# Patient Record
Sex: Female | Born: 1955 | Race: White | Hispanic: No | Smoking: Current every day smoker
Health system: Southern US, Community
[De-identification: ages and names within clinical notes are randomized; demographics above are authoritative.]

## PROBLEM LIST (undated history)

## (undated) DIAGNOSIS — M1990 Unspecified osteoarthritis, unspecified site: Secondary | ICD-10-CM

## (undated) DIAGNOSIS — J45909 Unspecified asthma, uncomplicated: Secondary | ICD-10-CM

## (undated) DIAGNOSIS — J449 Chronic obstructive pulmonary disease, unspecified: Secondary | ICD-10-CM

## (undated) DIAGNOSIS — J189 Pneumonia, unspecified organism: Secondary | ICD-10-CM

## (undated) DIAGNOSIS — E039 Hypothyroidism, unspecified: Secondary | ICD-10-CM

## (undated) DIAGNOSIS — G40409 Other generalized epilepsy and epileptic syndromes, not intractable, without status epilepticus: Secondary | ICD-10-CM

## (undated) DIAGNOSIS — J42 Unspecified chronic bronchitis: Secondary | ICD-10-CM

## (undated) HISTORY — PX: CATARACT EXTRACTION W/ INTRAOCULAR LENS  IMPLANT, BILATERAL: SHX1307

## (undated) HISTORY — PX: BREAST CYST EXCISION: SHX579

---

## 2016-04-02 ENCOUNTER — Encounter (HOSPITAL_COMMUNITY): Payer: Self-pay | Admitting: Emergency Medicine

## 2016-04-02 ENCOUNTER — Emergency Department (HOSPITAL_COMMUNITY): Payer: BLUE CROSS/BLUE SHIELD

## 2016-04-02 ENCOUNTER — Inpatient Hospital Stay (HOSPITAL_COMMUNITY)
Admission: EM | Admit: 2016-04-02 | Discharge: 2016-04-03 | DRG: 641 | Disposition: A | Discharge status: Rehabilitation Facility | Payer: BLUE CROSS/BLUE SHIELD | Attending: Internal Medicine | Admitting: Internal Medicine

## 2016-04-02 DIAGNOSIS — R569 Unspecified convulsions: Secondary | ICD-10-CM

## 2016-04-02 DIAGNOSIS — H538 Other visual disturbances: Secondary | ICD-10-CM | POA: Diagnosis present

## 2016-04-02 DIAGNOSIS — Z79899 Other long term (current) drug therapy: Secondary | ICD-10-CM | POA: Diagnosis not present

## 2016-04-02 DIAGNOSIS — F10239 Alcohol dependence with withdrawal, unspecified: Secondary | ICD-10-CM | POA: Diagnosis present

## 2016-04-02 DIAGNOSIS — Z833 Family history of diabetes mellitus: Secondary | ICD-10-CM | POA: Diagnosis not present

## 2016-04-02 DIAGNOSIS — R251 Tremor, unspecified: Secondary | ICD-10-CM

## 2016-04-02 DIAGNOSIS — Z8249 Family history of ischemic heart disease and other diseases of the circulatory system: Secondary | ICD-10-CM

## 2016-04-02 DIAGNOSIS — E871 Hypo-osmolality and hyponatremia: Secondary | ICD-10-CM | POA: Diagnosis present

## 2016-04-02 DIAGNOSIS — G40909 Epilepsy, unspecified, not intractable, without status epilepticus: Secondary | ICD-10-CM | POA: Diagnosis present

## 2016-04-02 DIAGNOSIS — E039 Hypothyroidism, unspecified: Secondary | ICD-10-CM

## 2016-04-02 DIAGNOSIS — Z885 Allergy status to narcotic agent status: Secondary | ICD-10-CM | POA: Diagnosis not present

## 2016-04-02 DIAGNOSIS — J449 Chronic obstructive pulmonary disease, unspecified: Secondary | ICD-10-CM | POA: Diagnosis present

## 2016-04-02 DIAGNOSIS — Z888 Allergy status to other drugs, medicaments and biological substances status: Secondary | ICD-10-CM | POA: Diagnosis not present

## 2016-04-02 DIAGNOSIS — Z720 Tobacco use: Secondary | ICD-10-CM | POA: Diagnosis not present

## 2016-04-02 DIAGNOSIS — R079 Chest pain, unspecified: Secondary | ICD-10-CM | POA: Diagnosis present

## 2016-04-02 DIAGNOSIS — Z90710 Acquired absence of both cervix and uterus: Secondary | ICD-10-CM | POA: Diagnosis not present

## 2016-04-02 DIAGNOSIS — R8290 Unspecified abnormal findings in urine: Secondary | ICD-10-CM

## 2016-04-02 DIAGNOSIS — F1010 Alcohol abuse, uncomplicated: Secondary | ICD-10-CM

## 2016-04-02 DIAGNOSIS — Z886 Allergy status to analgesic agent status: Secondary | ICD-10-CM

## 2016-04-02 DIAGNOSIS — F17200 Nicotine dependence, unspecified, uncomplicated: Secondary | ICD-10-CM

## 2016-04-02 HISTORY — DX: Chronic obstructive pulmonary disease, unspecified: J44.9

## 2016-04-02 HISTORY — DX: Unspecified chronic bronchitis: J42

## 2016-04-02 HISTORY — DX: Unspecified osteoarthritis, unspecified site: M19.90

## 2016-04-02 HISTORY — DX: Unspecified asthma, uncomplicated: J45.909

## 2016-04-02 HISTORY — DX: Other generalized epilepsy and epileptic syndromes, not intractable, without status epilepticus: G40.409

## 2016-04-02 HISTORY — DX: Pneumonia, unspecified organism: J18.9

## 2016-04-02 HISTORY — DX: Hypothyroidism, unspecified: E03.9

## 2016-04-02 LAB — RAPID URINE DRUG SCREEN, HOSP PERFORMED
Amphetamines: NOT DETECTED
BARBITURATES: NOT DETECTED
BENZODIAZEPINES: NOT DETECTED
COCAINE: NOT DETECTED
Opiates: NOT DETECTED
Tetrahydrocannabinol: NOT DETECTED

## 2016-04-02 LAB — VALPROIC ACID LEVEL: Valproic Acid Lvl: 95 ug/mL (ref 50.0–100.0)

## 2016-04-02 LAB — URINALYSIS, ROUTINE W REFLEX MICROSCOPIC
Bilirubin Urine: NEGATIVE
GLUCOSE, UA: NEGATIVE mg/dL
Hgb urine dipstick: NEGATIVE
Ketones, ur: NEGATIVE mg/dL
Nitrite: NEGATIVE
Protein, ur: NEGATIVE mg/dL
SPECIFIC GRAVITY, URINE: 1.015 (ref 1.005–1.030)
pH: 7 (ref 5.0–8.0)

## 2016-04-02 LAB — CBC
HEMATOCRIT: 36.7 % (ref 36.0–46.0)
Hemoglobin: 12.4 g/dL (ref 12.0–15.0)
MCH: 29.3 pg (ref 26.0–34.0)
MCHC: 33.8 g/dL (ref 30.0–36.0)
MCV: 86.8 fL (ref 78.0–100.0)
PLATELETS: 167 10*3/uL (ref 150–400)
RBC: 4.23 MIL/uL (ref 3.87–5.11)
RDW: 13.7 % (ref 11.5–15.5)
WBC: 5.7 10*3/uL (ref 4.0–10.5)

## 2016-04-02 LAB — BASIC METABOLIC PANEL
ANION GAP: 9 (ref 5–15)
BUN: 9 mg/dL (ref 6–20)
CALCIUM: 9.2 mg/dL (ref 8.9–10.3)
CO2: 26 mmol/L (ref 22–32)
Chloride: 97 mmol/L — ABNORMAL LOW (ref 101–111)
Creatinine, Ser: 0.74 mg/dL (ref 0.44–1.00)
GLUCOSE: 108 mg/dL — AB (ref 65–99)
POTASSIUM: 4.6 mmol/L (ref 3.5–5.1)
SODIUM: 132 mmol/L — AB (ref 135–145)

## 2016-04-02 LAB — TROPONIN I
Troponin I: <0.03 ng/mL (ref <0.03)
Troponin I: <0.03 ng/mL (ref <0.03)

## 2016-04-02 LAB — COMPREHENSIVE METABOLIC PANEL
ALT: 13 U/L — AB (ref 14–54)
AST: 26 U/L (ref 15–41)
Albumin: 3.7 g/dL (ref 3.5–5.0)
Alkaline Phosphatase: 70 U/L (ref 38–126)
Anion gap: 9 (ref 5–15)
BILIRUBIN TOTAL: 0.7 mg/dL (ref 0.3–1.2)
BUN: 11 mg/dL (ref 6–20)
CHLORIDE: 88 mmol/L — AB (ref 101–111)
CO2: 26 mmol/L (ref 22–32)
CREATININE: 0.57 mg/dL (ref 0.44–1.00)
Calcium: 9.6 mg/dL (ref 8.9–10.3)
Glucose, Bld: 95 mg/dL (ref 65–99)
Potassium: 4.6 mmol/L (ref 3.5–5.1)
Sodium: 123 mmol/L — ABNORMAL LOW (ref 135–145)
Total Protein: 6.3 g/dL — ABNORMAL LOW (ref 6.5–8.1)

## 2016-04-02 LAB — ETHANOL

## 2016-04-02 LAB — LIPASE, BLOOD: Lipase: 31 U/L (ref 11–51)

## 2016-04-02 LAB — TSH: TSH: 0.37 u[IU]/mL (ref 0.350–4.500)

## 2016-04-02 MED ORDER — SODIUM CHLORIDE 0.9 % IV SOLN
INTRAVENOUS | Status: DC
  Administered 2016-04-03: 100 mL/h via INTRAVENOUS

## 2016-04-02 MED ORDER — ACETAMINOPHEN 650 MG RE SUPP: 650.0000 mg | Freq: Four times a day (QID) | RECTAL | Status: DC | PRN

## 2016-04-02 MED ORDER — ACETAMINOPHEN 325 MG PO TABS: 650.0000 mg | ORAL_TABLET | Freq: Four times a day (QID) | ORAL | Status: DC | PRN

## 2016-04-02 MED ORDER — DIVALPROEX SODIUM 250 MG PO DR TAB
500.0000 mg | DELAYED_RELEASE_TABLET | Freq: Three times a day (TID) | ORAL | Status: DC
  Administered 2016-04-02 – 2016-04-03 (×4): 500 mg via ORAL
  Filled 2016-04-02 (×4): qty 2

## 2016-04-02 MED ORDER — FOLIC ACID 1 MG PO TABS
1.0000 mg | ORAL_TABLET | Freq: Every day | ORAL | Status: DC
  Administered 2016-04-02 – 2016-04-03 (×2): 1 mg via ORAL
  Filled 2016-04-02 (×2): qty 1

## 2016-04-02 MED ORDER — ALBUTEROL SULFATE (2.5 MG/3ML) 0.083% IN NEBU: 2.5000 mg | INHALATION_SOLUTION | Freq: Four times a day (QID) | RESPIRATORY_TRACT | Status: DC | PRN

## 2016-04-02 MED ORDER — ALBUTEROL SULFATE HFA 108 (90 BASE) MCG/ACT IN AERS: 1.0000 | INHALATION_SPRAY | Freq: Four times a day (QID) | RESPIRATORY_TRACT | Status: DC | PRN

## 2016-04-02 MED ORDER — ENOXAPARIN SODIUM 40 MG/0.4ML ~~LOC~~ SOLN: 40.0000 mg | SUBCUTANEOUS | Status: DC

## 2016-04-02 MED ORDER — THIAMINE HCL 100 MG/ML IJ SOLN: 100.0000 mg | Freq: Every day | INTRAMUSCULAR | Status: DC

## 2016-04-02 MED ORDER — SODIUM CHLORIDE 0.9 % IV SOLN
Freq: Once | INTRAVENOUS | Status: AC
  Administered 2016-04-02: 16:00:00 via INTRAVENOUS

## 2016-04-02 MED ORDER — SODIUM CHLORIDE 0.9 % IV BOLUS (SEPSIS)
1000.0000 mL | Freq: Once | INTRAVENOUS | Status: AC
  Administered 2016-04-02: 1000 mL via INTRAVENOUS

## 2016-04-02 MED ORDER — ADULT MULTIVITAMIN W/MINERALS CH
1.0000 | ORAL_TABLET | Freq: Every day | ORAL | Status: DC
  Administered 2016-04-02 – 2016-04-03 (×2): 1 via ORAL
  Filled 2016-04-02 (×2): qty 1

## 2016-04-02 MED ORDER — ONDANSETRON HCL 4 MG/2ML IJ SOLN: 4.0000 mg | Freq: Four times a day (QID) | INTRAMUSCULAR | Status: DC | PRN

## 2016-04-02 MED ORDER — ONDANSETRON HCL 4 MG PO TABS: 4.0000 mg | ORAL_TABLET | Freq: Four times a day (QID) | ORAL | Status: DC | PRN

## 2016-04-02 MED ORDER — IPRATROPIUM-ALBUTEROL 0.5-2.5 (3) MG/3ML IN SOLN
3.0000 mL | Freq: Two times a day (BID) | RESPIRATORY_TRACT | Status: DC
  Administered 2016-04-02 – 2016-04-03 (×2): 3 mL via RESPIRATORY_TRACT
  Filled 2016-04-02 (×2): qty 3

## 2016-04-02 MED ORDER — IPRATROPIUM-ALBUTEROL 20-100 MCG/ACT IN AERS: 1.0000 | INHALATION_SPRAY | Freq: Two times a day (BID) | RESPIRATORY_TRACT | Status: DC

## 2016-04-02 MED ORDER — NICOTINE 21 MG/24HR TD PT24
21.0000 mg | MEDICATED_PATCH | Freq: Once | TRANSDERMAL | Status: AC
  Administered 2016-04-02: 21 mg via TRANSDERMAL
  Filled 2016-04-02: qty 1

## 2016-04-02 MED ORDER — LORAZEPAM 2 MG/ML IJ SOLN: 1.0000 mg | Freq: Four times a day (QID) | INTRAMUSCULAR | Status: DC | PRN

## 2016-04-02 MED ORDER — LEVOTHYROXINE SODIUM 75 MCG PO TABS
75.0000 ug | ORAL_TABLET | Freq: Every day | ORAL | Status: DC
  Administered 2016-04-03: 75 ug via ORAL
  Filled 2016-04-02: qty 1

## 2016-04-02 MED ORDER — VITAMIN B-1 100 MG PO TABS
100.0000 mg | ORAL_TABLET | Freq: Every day | ORAL | Status: DC
  Administered 2016-04-02 – 2016-04-03 (×2): 100 mg via ORAL
  Filled 2016-04-02 (×2): qty 1

## 2016-04-02 MED ORDER — LORAZEPAM 1 MG PO TABS
1.0000 mg | ORAL_TABLET | Freq: Four times a day (QID) | ORAL | Status: DC | PRN
  Administered 2016-04-03: 1 mg via ORAL
  Filled 2016-04-02: qty 1

## 2016-04-02 MED ORDER — ENOXAPARIN SODIUM 40 MG/0.4ML ~~LOC~~ SOLN
40.0000 mg | SUBCUTANEOUS | Status: DC
  Administered 2016-04-02: 40 mg via SUBCUTANEOUS
  Filled 2016-04-02: qty 0.4

## 2016-04-02 MED ORDER — ALUM & MAG HYDROXIDE-SIMETH 200-200-20 MG/5ML PO SUSP
30.0000 mL | Freq: Four times a day (QID) | ORAL | Status: DC | PRN
  Administered 2016-04-03: 30 mL via ORAL
  Filled 2016-04-02: qty 30

## 2016-04-02 NOTE — ED Notes (Signed)
EKG given to Dr. Campos 

## 2016-04-02 NOTE — ED Notes (Signed)
Pt returned to hallway bed from x-ray.  

## 2016-04-02 NOTE — ED Triage Notes (Addendum)
Was at [facility] for rehab , has been detox for benzo and etoh and was in a class and started to have  Sharp cp/ upper epigastric pain  with some nausea and broke out in sweat, pt felt she may benn to hot and once they gave her 4 baby asa and took her oout of  Room she felt better, per [facility] pt was unsteady on her feet but may have  Been her tegratol pt states, pt reports blurred vision since yesterday

## 2016-04-02 NOTE — Progress Notes (Signed)
Anna DelayMary [name] [id number] Admitted to 5W14: 04/02/2016 6:56 PM Attending Provider: Ozella Rocksavid J Merrell, MD    Anna [name] is a 61 y.o. female patient admitted from ED awake, alert  & orientated  X 3,  Full Code, VSS - Blood pressure 128/78, pulse 78, temperature 99 F (37.2 C), temperature source Oral, resp. rate 16, height 5\' 2"  (1.575 m), weight 50.8 kg (112 lb), SpO2 99 %., R/A, no c/o shortness of breath, no c/o chest pain, no distress noted. Tele # [id number] placed and pt is currently running:normal sinus rhythm, 1st degree heartblock.   IV site WDL:  Antecubital, left condition patent and no redness with a transparent dsg that's clean dry and intact.  Allergies:   Allergies  Allergen Reactions  . Asa [Aspirin]     upset stomach  . Dilantin [Phenytoin Sodium Extended]     Does not stop seizures  . Morphine And Related     Anaphylaxis      Past Medical History:  Diagnosis Date  . Seizures (HCC)     History:  Will be obtained from the patient.  Pt orientation to unit, room and routine. Information packet given to patient/family and safety video watched.  Admission INP armband ID verified with patient/family, and in place. SR up x 2, fall risk assessment complete with Patient and family verbalizing understanding of risks associated with falls. Pt verbalizes an understanding of how to use the call bell and to call for help before getting out of bed.  Skin, clean-dry- intact without evidence of bruising, or skin tears, red rash noted on right side of back, right and left buttock cheek .   No evidence of skin break down noted on exam.    Will cont to monitor and assist as needed.  Joana ReamerJohnson, Tyvon Eggenberger C, RN 04/02/2016 6:56 PM

## 2016-04-02 NOTE — Progress Notes (Signed)
Bedelia PersonEric Isbanioly, RN from [facility] called to get updates on pt. And stated they are holding her bed there.  Will continue to follow.  Forbes Cellarelcine Aleph Nickson, RN

## 2016-04-02 NOTE — ED Notes (Signed)
Pt transported to xray 

## 2016-04-02 NOTE — ED Provider Notes (Signed)
[facility]-EMERGENCY DEPT Provider Note   CSN: [id number] Arrival date & time: 04/02/16  1227     History   Chief Complaint Chief Complaint  Patient presents with  . Chest Pain    HPI Anna [name] is a 61 y.o. female.  HPI   Anna [name] is a 61 y.o. female, with a history of seizures, alcohol abuse, and benzodiazepine abuse, presenting to the ED with Onset of chest pain while at rest today. Patient states that she was sitting in a classroom watching a video when she began to feel sweaty and nauseous, followed by central chest pain. The chest pain was sharp, moderate, nonradiating. The pain lasted for approximately 15 minutes. 324mg  ASA at the facility prior to EMS arrival. Patient also endorses intermittent episodes of "unsteadiness" and bilateral blurred vision beginning yesterday. Patient states, "I feel like I just can't catch my balance." Patient complains of a nonproductive cough for the last couple weeks. Patient had one of these episodes following the chest pain today. Patient is currently pain and complaint free. Pt adds that she is compliant with her Depakote and states that [facility] Teg[facility] to her regimen beginning on February 9. She denies fever, vomiting/diarrhea, abdominal pain, shortness of breath, or any other complaints.  Patient states that she was admitted [facility] on February [facility] for hyponatremia and detox from benzodiazepines. She was admitted for 6 days. Upon discharge, [facility] and [facility] from alcohol. She began treatment there on February 9.    Past Medical History:  Diagnosis Date  . Seizures [facility](HCC)     Patient Active Problem List   Diagnosis Date Noted  . Hyponatremia 04/02/2016    History reviewed. No pertinent surgical history.  OB History    No data available       Home Medications    Prior to Admission medications   Medication Sig Start Date End Date Taking? Authorizing  Provider  albuterol (PROVENTIL HFA;VENTOLIN HFA) 108 (90 Base) MCG/ACT inhaler Inhale 1 puff into the lungs every 6 (six) hours as needed for wheezing or shortness of breath.   Yes Historical Provider, MD  divalproex (DEPAKOTE) 500 MG DR tablet Take 500 mg by mouth 3 (three) times daily.   Yes Historical Provider, MD  folic acid (FOLVITE) 1 MG tablet Take 1 mg by mouth daily.   Yes Historical Provider, MD  Ipratropium-Albuterol (COMBIVENT RESPIMAT) 20-100 MCG/ACT AERS respimat Inhale 1 puff into the lungs 2 (two) times daily.   Yes Historical Provider, MD  levothyroxine (SYNTHROID, LEVOTHROID) 75 MCG tablet Take 75 mcg by mouth daily before breakfast.   Yes Historical Provider, MD    Family History No family history on file.  Social History Social History  Substance Use Topics  . Smoking status: Never Smoker  . Smokeless tobacco: Never Used  . Alcohol use No     Allergies   Asa [aspirin]; Dilantin [phenytoin sodium extended]; and Morphine and related   Review of Systems Review of Systems  Constitutional: Positive for diaphoresis (resolved). Negative for chills and fever.  Respiratory: Positive for cough. Negative for shortness of breath.   Cardiovascular: Positive for chest pain (resolved).  Gastrointestinal: Positive for nausea. Negative for abdominal pain, blood in stool, constipation, diarrhea and vomiting.  Genitourinary: Negative for dysuria.  Neurological: Positive for tremors. Negative for dizziness, syncope, weakness, light-headedness, numbness and headaches.       Balance issue  All other systems reviewed and are negative.  Physical Exam Updated Vital Signs BP 136/78 (BP Location: Right Arm)   Pulse 74   Temp 98.6 F (37 C) (Oral)   Resp 16   SpO2 100%   Physical Exam  Constitutional: She is oriented to person, place, and time. She appears well-developed and well-nourished. No distress.  HENT:  Head: Normocephalic and atraumatic.  Mouth/Throat: Oropharynx  is clear and moist.  Eyes: Conjunctivae and EOM are normal. Pupils are equal, round, and reactive to light.  Neck: Normal range of motion. Neck supple.  Cardiovascular: Normal rate, regular rhythm, normal heart sounds and intact distal pulses.   Pulmonary/Chest: Effort normal and breath sounds normal. No respiratory distress.  Abdominal: Soft. There is no tenderness. There is no guarding.  Musculoskeletal: She exhibits no edema.  Lymphadenopathy:    She has no cervical adenopathy.  Neurological: She is alert and oriented to person, place, and time.  No sensory deficits. Strength 5/5 in all extremities. Coordination intact including heel to shin and finger to nose, but with an upper extremity bilateral resting and intention tremor. Cranial nerves III-XII grossly intact. No facial droop.   Skin: Skin is warm and dry. Capillary refill takes less than 2 seconds. She is not diaphoretic.  Psychiatric: She has a normal mood and affect. Her behavior is normal.  Nursing note and vitals reviewed.    ED Treatments / Results  Labs (all labs ordered are listed, but only abnormal results are displayed) Labs Reviewed  COMPREHENSIVE METABOLIC PANEL - Abnormal; Notable for the following:       Result Value   Sodium 123 (*)    Chloride 88 (*)    Total Protein 6.3 (*)    ALT 13 (*)    All other components within normal limits  URINALYSIS, ROUTINE W REFLEX MICROSCOPIC - Abnormal; Notable for the following:    APPearance CLOUDY (*)    Leukocytes, UA LARGE (*)    Bacteria, UA FEW (*)    Squamous Epithelial / LPF 0-5 (*)    All other components within normal limits  URINE CULTURE  CBC  LIPASE, BLOOD  RAPID URINE DRUG SCREEN, HOSP PERFORMED  ETHANOL  TROPONIN I  TROPONIN I  VALPROIC ACID LEVEL  TSH    EKG  EKG Interpretation  Date/Time:  Monday April 02 2016 12:54:43 EST Ventricular Rate:  77 PR Interval:  220 QRS Duration: 80 QT Interval:  378 QTC Calculation: 427 R Axis:   74 Text  Interpretation:  Sinus rhythm with 1st degree A-V block Possible Left atrial enlargement Borderline ECG No old tracing to compare Confirmed by CAMPOS  MD, Caryn Bee ([id number]) on 04/02/2016 1:56:48 PM       Radiology Dg Chest 2 View  Result Date: 04/02/2016 CLINICAL DATA:  Epigastric pain and nausea with sledding today, chest pain EXAM: CHEST  2 VIEW COMPARISON:  None FINDINGS: Normal heart size, mediastinal contours, and pulmonary vascularity. Atherosclerotic calcification aorta. Emphysematous changes with minimal central peribronchial thickening. No acute infiltrate, pleural effusion, or pneumothorax. Bones demineralized. 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 IMPRESSION: COPD changes without acute infiltrate. Aortic atherosclerosis. Electronically Signed   By: Ulyses Southward M.D.   On: 04/02/2016 13:12    Procedures Procedures (including critical care time)  Medications Ordered in ED Medications  sodium chloride 0.9 % bolus 1,000 mL (0 mLs Intravenous Stopped 04/02/16 1500)  0.9 %  sodium chloride infusion ( Intravenous New Bag/Given 04/02/16 1552)     Initial Impression / Assessment and Plan / ED Course  I have reviewed the triage vital signs and the nursing notes.  Pertinent labs & imaging results that were available during my care of the patient were reviewed by me and considered in my medical decision making (see chart for details).     Patient presents following an episode of chest discomfort along with accompanying symptoms. No recurrence of the patient's chest discomfort. Delta troponins negative. Patient is found to be hyponatremic at 123. This could explain some of the patient's symptoms, including the reported ataxia. No reported seizures.  4:07 PM Spoke with Dr. Konrad Dolores, hospitalist, who agreed to admit the patient.   Findings and plan of care discussed with Azalia Bilis, MD. Dr. Patria Mane personally evaluated and examined this patient.   Vitals:   04/02/16 1229 04/02/16 1241  BP:   136/78  Pulse:  74  Resp:  16  Temp:  98.6 F (37 C)  TempSrc:  Oral  SpO2: 99% 100%   Orthostatic VS for the past 24 hrs:  BP- Lying Pulse- Lying BP- Sitting Pulse- Sitting BP- Standing at 0 minutes Pulse- Standing at 0 minutes  04/02/16 1339 153/83 70 (!) 160/92 77 138/88 82      Final Clinical Impressions(s) / ED Diagnoses   Final diagnoses:  Hyponatremia    New Prescriptions New Prescriptions   No medications on file     Concepcion Living 04/02/16 1609    Azalia Bilis, MD 04/04/16 [phone]

## 2016-04-02 NOTE — H&P (Signed)
History and Physical    Merdis DelayMary Carothers ZOX:[id number] DOB: [birth year] DOA: 04/02/2016  PCP: No primary care provider on file. Patient coming from: home  Chief Complaint: CP and "can't catch my balance."  HPI: Merdis DelayMary Erway is a 61 y.o. female with medical history significant of Seizures.   Chest pain. Started during school while watching a video. Associated with nausea and diaphoresis. Patient states that the room was very hot. Sharp and nonradiating substernal. Lasted approximately 15 minutes. Chest pain relieved after she went to the school nurse and was given ASA 324 mg and rested. No other reported chest pain in the past. Denies any dyspnea on exertion, lower extremity swelling,.  Pt recently admitted [facility] for alcohol detox and hyponatremia. Patient unsure of what her sodium level was at that time. Patient stayed for approximately 9 days. Discharged on 03/31/2016. Patient reports feeling at her baseline at the time. Patient was not given any further medications including benzodiazepines. Patient started working with a local rehabilitation center for alcoholics. Again patient has been without any medications since time of discharge. Patient complaining of intermittent blurry vision and difficulty with balance. Denies any other focal abnormality.  Patient also with several history of intermittent tremor. Sometimes at rest and sometimes intention based.  ED Course: Objective findings outlined below. Given 1 L normal saline bolus and placed on the 21 mg nicotine patch.   Review of Systems: As per HPI otherwise 10 point review of systems negative.   Ambulatory Status: no restrictions  Past Medical History:  Diagnosis Date  . Seizures (HCC)     Past Surgical History:  Procedure Laterality Date  . ABDOMINAL HYSTERECTOMY    . CATARACT EXTRACTION      Social History   Social History  . Marital status: Widowed    Spouse name: N/A  . Number of children: N/A  . Years of  education: N/A   Occupational History  . Not on file.   Social History Main Topics  . Smoking status: Never Smoker  . Smokeless tobacco: Never Used  . Alcohol use Yes  . Drug use: Unknown  . Sexual activity: Not on file   Other Topics Concern  . Not on file   Social History Narrative  . No narrative on file    Allergies  Allergen Reactions  . Asa [Aspirin]     upset stomach  . Dilantin [Phenytoin Sodium Extended]     Does not stop seizures  . Morphine And Related     Anaphylaxis     Family History  Problem Relation Age of Onset  . Diabetes Mother   . Heart failure Mother   . Cancer Father     Prior to Admission medications   Medication Sig Start Date End Date Taking? Authorizing Provider  albuterol (PROVENTIL HFA;VENTOLIN HFA) 108 (90 Base) MCG/ACT inhaler Inhale 1 puff into the lungs every 6 (six) hours as needed for wheezing or shortness of breath.   Yes Historical Provider, MD  divalproex (DEPAKOTE) 500 MG DR tablet Take 500 mg by mouth 3 (three) times daily.   Yes Historical Provider, MD  folic acid (FOLVITE) 1 MG tablet Take 1 mg by mouth daily.   Yes Historical Provider, MD  Ipratropium-Albuterol (COMBIVENT RESPIMAT) 20-100 MCG/ACT AERS respimat Inhale 1 puff into the lungs 2 (two) times daily.   Yes Historical Provider, MD  levothyroxine (SYNTHROID, LEVOTHROID) 75 MCG tablet Take 75 mcg by mouth daily before breakfast.   Yes Historical Provider, MD  Physical Exam: Vitals:   04/02/16 1229 04/02/16 1241 04/02/16 1635 04/02/16 1736  BP:  136/78 107/76   Pulse:  74 88   Resp:  16 16   Temp:  98.6 F (37 C)    TempSrc:  Oral    SpO2: 99% 100% 97%   Weight:    50.8 kg (112 lb)  Height:    5\' 2"  (1.575 m)     General:  Appears calm and comfortable Eyes:  PERRL, EOMI, normal lids, iris ENT:  grossly normal hearing, lips & tongue, mmm Neck:  no LAD, masses or thyromegaly Cardiovascular:  RRR, no m/r/g. No LE edema.  Respiratory:  CTA bilaterally, no  w/r/r. Normal respiratory effort. Abdomen:  soft, ntnd, NABS Skin:  no rash or induration seen on limited exam Musculoskeletal:  grossly normal tone BUE/BLE, good ROM, no bony abnormality Psychiatric:  grossly normal mood and affect, speech fluent and appropriate, AOx3 Neurologic: Intermittent intention tremor. CN 2-12 grossly intact, moves all extremities in coordinated fashion, sensation intact  Labs on Admission: I have personally reviewed following labs and imaging studies  CBC:  Recent Labs Lab 04/02/16 1239  WBC 5.7  HGB 12.4  HCT 36.7  MCV 86.8  PLT 167   Basic Metabolic Panel:  Recent Labs Lab 04/02/16 1239  NA 123*  K 4.6  CL 88*  CO2 26  GLUCOSE 95  BUN 11  CREATININE 0.57  CALCIUM 9.6   GFR: Estimated Creatinine Clearance: 59.9 mL/min (by C-G formula based on SCr of 0.57 mg/dL). Liver Function Tests:  Recent Labs Lab 04/02/16 1239  AST 26  ALT 13*  ALKPHOS 70  BILITOT 0.7  PROT 6.3*  ALBUMIN 3.7    Recent Labs Lab 04/02/16 1239  LIPASE 31   No results for input(s): AMMONIA in the last 168 hours. Coagulation Profile: No results for input(s): INR, PROTIME in the last 168 hours. Cardiac Enzymes:  Recent Labs Lab 04/02/16 1239  TROPONINI <0.03   BNP (last 3 results) No results for input(s): PROBNP in the last 8760 hours. HbA1C: No results for input(s): HGBA1C in the last 72 hours. CBG: No results for input(s): GLUCAP in the last 168 hours. Lipid Profile: No results for input(s): CHOL, HDL, LDLCALC, TRIG, CHOLHDL, LDLDIRECT in the last 72 hours. Thyroid Function Tests: No results for input(s): TSH, T4TOTAL, FREET4, T3FREE, THYROIDAB in the last 72 hours. Anemia Panel: No results for input(s): VITAMINB12, FOLATE, FERRITIN, TIBC, IRON, RETICCTPCT in the last 72 hours. Urine analysis:    Component Value Date/Time   COLORURINE YELLOW 04/02/2016 1312   APPEARANCEUR CLOUDY (A) 04/02/2016 1312   LABSPEC 1.015 04/02/2016 1312   PHURINE  7.0 04/02/2016 1312   GLUCOSEU NEGATIVE 04/02/2016 1312   HGBUR NEGATIVE 04/02/2016 1312   BILIRUBINUR NEGATIVE 04/02/2016 1312   KETONESUR NEGATIVE 04/02/2016 1312   PROTEINUR NEGATIVE 04/02/2016 1312   NITRITE NEGATIVE 04/02/2016 1312   LEUKOCYTESUR LARGE (A) 04/02/2016 1312    Creatinine Clearance: Estimated Creatinine Clearance: 59.9 mL/min (by C-G formula based on SCr of 0.57 mg/dL).  Sepsis Labs: @LABRCNTIP (procalcitonin:4,lacticidven:4) )No results found for this or any previous visit (from the past 240 hour(s)).   Radiological Exams on Admission: Dg Chest 2 View  Result Date: 04/02/2016 CLINICAL DATA:  Epigastric pain and nausea with sledding today, chest pain EXAM: CHEST  2 VIEW COMPARISON:  None FINDINGS: Normal heart size, mediastinal contours, and pulmonary vascularity. Atherosclerotic calcification aorta. Emphysematous changes with minimal central peribronchial thickening. No acute infiltrate, pleural effusion, or  pneumothorax. Bones demineralized. 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 IMPRESSION: COPD changes without acute infiltrate. Aortic atherosclerosis. Electronically Signed   By: Ulyses Southward M.D.   On: 04/02/2016 13:12    EKG: Independently reviewed. Sinus. First-degree AV block. No ACS  Assessment/Plan Active Problems:   Hyponatremia   ETOH abuse   Seizures (HCC)   Abnormal urinalysis   Tremor   Hypothyroidism   Tobacco use disorder   COPD, mild (HCC)   Chest pain: Alcohol and benzodiazepine withdrawal versus cardiac versus psychosomatic versus infectious. EKG reassuring. Troponin negative. No cardiac history. - Telemetry, observation - Cycle troponins, EKG in a.m. - Outpatient follow-up with cardiology  Hyponatremia: 123. Likely from ETOH use. Recent admission for the same at [facility]. Goal of correction less than 47mmol/L/day.  - NS IVF - BMET Q12  EtOH withdrawal: Patient's baseline of 9 beers per day. States that she recently went  through with all hospitalization at [facility] for 9 days. Discharged 2 days ago without any further medications/benzodiazepines. Suspect that patient's complaints of intermittent blurry vision, balance issues, chest pain with diaphoresis likely from withdrawal. - CIWA - Social worker consult - Patient likely to need a limited course of benzodiazepine at time of discharge she has no primary care physician in the area.  Seizures: Patient sure of her last seizure. Withdrawal from significant alcohol use but patient risk for seizing. - Continue Depakote - Depakote level  Abnormal urinalysis: Patient without focal UTI type complaint. - Urine culture - Start antibiotics if patient becomes symptomatic or culture comes back positive  Tremor: Chronic for years. Anticipate improvement w/ benzodiazepine treatment.  - outpt f/u w/ Neuro (parkinsons vs ETOH withdrawal/induced vs other)  Hypothyroidism: - Continue Synthroid  COPD/asthma: - Continue Combivent, when necessary albuterol  Tobacco use: - Nicotine   DVT prophylaxis: Lovenox  Code Status: FULL  Family Communication: none  Disposition Plan: pending imporovement in NA and ETOH withdrawal  Consults called: none  Admission status: inpt    MERRELL, DAVID J MD [facility]  If 7PM-7AM, please contact night-coverage [email or web address] Password [facility]  04/02/2016, 5:42 PM

## 2016-04-02 NOTE — Progress Notes (Signed)
Report received 

## 2016-04-02 NOTE — ED Notes (Signed)
Attempted report x1. 

## 2016-04-03 DIAGNOSIS — E871 Hypo-osmolality and hyponatremia: Principal | ICD-10-CM

## 2016-04-03 DIAGNOSIS — F1010 Alcohol abuse, uncomplicated: Secondary | ICD-10-CM

## 2016-04-03 DIAGNOSIS — F17200 Nicotine dependence, unspecified, uncomplicated: Secondary | ICD-10-CM

## 2016-04-03 LAB — TROPONIN I

## 2016-04-03 LAB — URINE CULTURE

## 2016-04-03 LAB — CBC
HCT: 38.8 % (ref 36.0–46.0)
HEMOGLOBIN: 12.9 g/dL (ref 12.0–15.0)
MCH: 29.2 pg (ref 26.0–34.0)
MCHC: 33.2 g/dL (ref 30.0–36.0)
MCV: 87.8 fL (ref 78.0–100.0)
Platelets: 162 10*3/uL (ref 150–400)
RBC: 4.42 MIL/uL (ref 3.87–5.11)
RDW: 13.9 % (ref 11.5–15.5)
WBC: 4.9 10*3/uL (ref 4.0–10.5)

## 2016-04-03 LAB — BASIC METABOLIC PANEL
ANION GAP: 11 (ref 5–15)
BUN: 6 mg/dL (ref 6–20)
CALCIUM: 9.2 mg/dL (ref 8.9–10.3)
CO2: 24 mmol/L (ref 22–32)
Chloride: 99 mmol/L — ABNORMAL LOW (ref 101–111)
Creatinine, Ser: 0.43 mg/dL — ABNORMAL LOW (ref 0.44–1.00)
GLUCOSE: 65 mg/dL (ref 65–99)
POTASSIUM: 4.1 mmol/L (ref 3.5–5.1)
Sodium: 134 mmol/L — ABNORMAL LOW (ref 135–145)

## 2016-04-03 MED ORDER — CEPHALEXIN 500 MG PO CAPS: 500.0000 mg | ORAL_CAPSULE | Freq: Two times a day (BID) | ORAL | 0 refills | Status: AC

## 2016-04-03 MED ORDER — NICOTINE 21 MG/24HR TD PT24
21.0000 mg | MEDICATED_PATCH | Freq: Every day | TRANSDERMAL | Status: DC
  Administered 2016-04-03: 21 mg via TRANSDERMAL
  Filled 2016-04-03: qty 1

## 2016-04-03 MED ORDER — CEPHALEXIN 500 MG PO CAPS
500.0000 mg | ORAL_CAPSULE | Freq: Two times a day (BID) | ORAL | Status: DC
  Administered 2016-04-03: 500 mg via ORAL
  Filled 2016-04-03: qty 1

## 2016-04-03 NOTE — Progress Notes (Signed)
CSW confirmed with pt she is from [facility] and plan is to return at time of DC.  CSW spoke with RN at [facility] and faxed requested clinicals to facility (fax #: [phone])  RN to review and will set up transport for patient if approved  CSW will continue to follow  Burna SisJenna H. Renan Danese, LCSW Clinical Social Worker [phone]

## 2016-04-03 NOTE — Progress Notes (Signed)
NURSING PROGRESS NOTE  Anna [name] [id number] Discharge Data: 04/03/2016 3:01 PM Attending Provider: Albertine GratesFang Xu, MD PCP:Pcp Not In System     [name] per MD order.  Discussed with the patient the After Visit Summary and all questions fully answered. All IV's discontinued with no bleeding noted. All belongings returned to patient for patient to take home.   Last Vital Signs:  Blood pressure 127/88, pulse 81, temperature 98.8 F (37.1 C), temperature source Oral, resp. rate 20, height 5\' 2"  (1.575 m), weight 50.8 kg (112 lb), SpO2 99 %.  Discharge Medication List Allergies as of 04/03/2016      Reactions   Asa [aspirin]    upset stomach   Dilantin [phenytoin Sodium Extended]    Does not stop seizures   Morphine And Related    Anaphylaxis       Medication List    TAKE these medications   albuterol 108 (90 Base) MCG/ACT inhaler Commonly known as:  PROVENTIL HFA;VENTOLIN HFA Inhale 1 puff into the lungs every 6 (six) hours as needed for wheezing or shortness of breath.   cephALEXin 500 MG capsule Commonly known as:  KEFLEX Take 1 capsule (500 mg total) by mouth every 12 (twelve) hours.   COMBIVENT RESPIMAT 20-100 MCG/ACT Aers respimat Generic drug:  Ipratropium-Albuterol Inhale 1 puff into the lungs 2 (two) times daily.   divalproex 500 MG DR tablet Commonly known as:  DEPAKOTE Take 500 mg by mouth 3 (three) times daily.   folic acid 1 MG tablet Commonly known as:  FOLVITE Take 1 mg by mouth daily.   levothyroxine 75 MCG tablet Commonly known as:  SYNTHROID, LEVOTHROID Take 75 mcg by mouth daily before breakfast.

## 2016-04-03 NOTE — Progress Notes (Signed)
[facility].  Dois DavenportSandra, RN at [facility], requesting PT note because [facility]  PT ordered stat and cleared pt- PT notes sent to facility and facility sending transport to pick up patient.  CSW signing off  Burna SisJenna H. Dinita Migliaccio, LCSW Clinical Social Worker [phone]'

## 2016-04-03 NOTE — Discharge Summary (Signed)
Discharge Summary  Anna [name] ZOX:[id number] DOB: 1955-05-04  PCP: Pcp Not In System  Admit date: 04/02/2016 Discharge date: 04/03/2016  Time spent: <30mins  [facility]  Recommendations for Outpatient Follow-up:  1. F/u with PMD within a week  for hospital discharge follow up, repeat cbc/bmp at follow up 2. F/u with neurology   Discharge Diagnoses:  Active Hospital Problems   Diagnosis Date Noted  . Hyponatremia 04/02/2016  . ETOH abuse 04/02/2016  . Seizures (HCC) 04/02/2016  . Abnormal urinalysis 04/02/2016  . Tremor 04/02/2016  . Hypothyroidism 04/02/2016  . Tobacco use disorder 04/02/2016  . 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    Discharge Condition: stable  Diet recommendation: regular diet, free waster restriction to avoid hyponatremia Patient is advised to drink beverages that contains sodium  Filed Weights   04/02/16 1736  Weight: 50.8 kg (112 lb)    History of present illness:  Patient coming from: home  Chief Complaint: CP and "can't catch my balance."  HPI: Anna [name] is a 61 y.o. female with medical history significant of Seizures.   Chest pain. Started during school while watching a video. Associated with nausea and diaphoresis. Patient states that the room was very hot. Sharp and nonradiating substernal. Lasted approximately 15 minutes. Chest pain relieved after she went to the school nurse and was given ASA 324 mg and rested. No other reported chest pain in the past. Denies any dyspnea on exertion, lower extremity swelling,.  Pt recently admitted to [facility] for alcohol detox and hyponatremia. Patient unsure of what her sodium level was at that time. Patient stayed for approximately 9 days. Discharged on 03/31/2016. Patient reports feeling at her baseline at the time. Patient was not given any further medications  including benzodiazepines. Patient started working with a local rehabilitation center for alcoholics. Again patient has been without any medications since time of discharge. Patient complaining of intermittent blurry vision and difficulty with balance. Denies any other focal abnormality.  Patient also with several history of intermittent tremor. Sometimes at rest and sometimes intention based.  ED Course: Objective findings outlined below. Given 1 L normal saline bolus and placed on the 21 mg nicotine patch.    Hospital Course:  Active Problems:   Hyponatremia   ETOH abuse   Seizures (HCC)   Abnormal urinalysis   Tremor   Hypothyroidism   Tobacco use disorder   COPD, mild (HCC)  Hyponatremia:  Likely from beer potomania, she is also on depakote which could contribute to hyponatremia, however, she report she has been on this meds for more than 24yrs, and it has controlled her seizure well, her last seizure was 7years ago.  She received hydration, sodium normalized. She is advise to avoid free water,  She should have sodium level repeated , if this remains an issue after she stops drinking alcohol, consider to follow up with neurology for seizure medication adjustment.  Hypothyroidism:  Continue synthroid  Alcohol use: currently [facility], she is to return [facility] for continued treatment  H/o seizure disorder, report last seizure 7 yrs ago, continue depakote.   Abnormal urinalysis:  ua + bacteria, + leukocyte esterease, negative nitrite, wbc 6-30 she report urine seems darker the past couple of days, start on keflex for total of 5 days  Tremor: Chronic for years. Anticipate improvement w/ benzodiazepine treatment.  -  outpt f/u w/ Neuro (parkinsons vs ETOH withdrawal/induced vs other)   COPD/asthma: - Continue Combivent, when necessary albuterol -no wheezing, no cough, on room air  Tobacco use: -  Nicotine  Procedures:  none  Consultations:  none  Discharge Exam: BP 132/77 (BP Location: Left Arm)   Pulse (!) 108   Temp 97.9 F (36.6 C) (Oral)   Resp 18   Ht 5\' 2"  (1.575 m)   Wt 50.8 kg (112 lb)   SpO2 98%   BMI 20.49 kg/m   General: NAD Cardiovascular: RRR Respiratory: CTABL  Discharge Instructions You were cared for by a hospitalist during your hospital stay. If you have any questions about your discharge medications or the care you received while you were in the hospital after you are discharged, you can call the unit and asked to speak with the hospitalist on call if the hospitalist that took care of you is not available. Once you are discharged, your primary care physician will handle any further medical issues. Please note that NO REFILLS for any discharge medications will be authorized once you are discharged, as it is imperative that you return to your primary care physician (or establish a relationship with a primary care physician if you do not have one) for your aftercare needs so that they can reassess your need for medications and monitor your lab values.  Discharge Instructions    Diet general    Complete by:  As directed    Regular diet, free water restriction due to tendency to have hyponatremia   Increase activity slowly    Complete by:  As directed      Allergies as of 04/03/2016      Reactions   Asa [aspirin]    upset stomach   Dilantin [phenytoin Sodium Extended]    Does not stop seizures   Morphine And Related    Anaphylaxis       Medication List    TAKE these medications   albuterol 108 (90 Base) MCG/ACT inhaler Commonly known as:  PROVENTIL HFA;VENTOLIN HFA Inhale 1 puff into the lungs every 6 (six) hours as needed for wheezing or shortness of breath.   cephALEXin 500 MG capsule Commonly known as:  KEFLEX Take 1 capsule (500 mg total) by mouth every 12 (twelve) hours.   COMBIVENT RESPIMAT 20-100 MCG/ACT Aers respimat Generic drug:   Ipratropium-Albuterol Inhale 1 puff into the lungs 2 (two) times daily.   divalproex 500 MG DR tablet Commonly known as:  DEPAKOTE Take 500 mg by mouth 3 (three) times daily.   folic acid 1 MG tablet Commonly known as:  FOLVITE Take 1 mg by mouth daily.   levothyroxine 75 MCG tablet Commonly known as:  SYNTHROID, LEVOTHROID Take 75 mcg by mouth daily before breakfast.      Allergies  Allergen Reactions  . Asa [Aspirin]     upset stomach  . Dilantin [Phenytoin Sodium Extended]     Does not stop seizures  . Morphine And Related     Anaphylaxis    Follow-up Information    please follow up with neurology Follow up.        please follow up with primary care physician,repeat sodium level at follow up. Follow up.            The results of significant diagnostics from this hospitalization (including imaging, microbiology, ancillary and laboratory) are listed below for reference.    Significant Diagnostic Studies: Dg Chest 2 View  Result Date: 04/02/2016 CLINICAL  DATA:  Epigastric pain and nausea with sledding today, chest pain EXAM: CHEST  2 VIEW COMPARISON:  None FINDINGS: Normal heart size, mediastinal contours, and pulmonary vascularity. Atherosclerotic calcification aorta. Emphysematous changes with minimal central peribronchial thickening. No acute infiltrate, pleural effusion, or pneumothorax. Bones demineralized. 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 IMPRESSION: COPD changes without acute infiltrate. Aortic atherosclerosis. Electronically Signed   By: Ulyses SouthwardMark  Boles M.D.   On: 04/02/2016 13:12    Microbiology: Recent Results (from the past 240 hour(s))  Urine culture     Status: Abnormal   Collection Time: 04/02/16  2:06 PM  Result Value Ref Range Status   Specimen Description URINE, CLEAN CATCH  Final   Special Requests NONE  Final   Culture MULTIPLE SPECIES PRESENT, SUGGEST RECOLLECTION (A)  Final   Report Status 04/03/2016 FINAL  Final     Labs: Basic  Metabolic Panel:  Recent Labs Lab 04/02/16 1239 04/02/16 1804 04/03/16 0539  NA 123* 132* 134*  K 4.6 4.6 4.1  CL 88* 97* 99*  CO2 26 26 24   GLUCOSE 95 108* 65  BUN 11 9 6   CREATININE 0.57 0.74 0.43*  CALCIUM 9.6 9.2 9.2   Liver Function Tests:  Recent Labs Lab 04/02/16 1239  AST 26  ALT 13*  ALKPHOS 70  BILITOT 0.7  PROT 6.3*  ALBUMIN 3.7    Recent Labs Lab 04/02/16 1239  LIPASE 31   No results for input(s): AMMONIA in the last 168 hours. CBC:  Recent Labs Lab 04/02/16 1239 04/03/16 0539  WBC 5.7 4.9  HGB 12.4 12.9  HCT 36.7 38.8  MCV 86.8 87.8  PLT 167 162   Cardiac Enzymes:  Recent Labs Lab 04/02/16 1239 04/02/16 1702 04/02/16 2339  TROPONINI <0.03 <0.03 <0.03   BNP: BNP (last 3 results) No results for input(s): BNP in the last 8760 hours.  ProBNP (last 3 results) No results for input(s): PROBNP in the last 8760 hours.  CBG: No results for input(s): GLUCAP in the last 168 hours.     SignedAlbertine Grates:  Casee Knepp MD, PhD  [facility] 04/03/2016, 11:24 AM

## 2016-04-03 NOTE — Evaluation (Signed)
Physical Therapy Evaluation Patient Details Name: Anna [name] MRN: [id number] DOB: 1955/02/29 Today's Date: 04/03/2016   History of Present Illness  Admitted with Chest pain and dizziness; hyponatremia; PMH: seizures  Clinical Impression  Patient evaluated by Physical Therapy with no further acute PT needs identified. [name] education has been completed and the patient has no further questions. Anna [name] is overall moving well, and is able to self-monitor for activity tolerance; she tells me she has been through a lot and looks forward to continuing her work at the [employer];  See below for any follow-up Physical Therapy or equipment needs. PT is signing off. Thank you for this referral.     Follow Up Recommendations No PT follow up    Equipment Recommendations  None recommended by PT    Recommendations for Other Services       Precautions / Restrictions Precautions Precautions: None      Mobility  Bed Mobility Overal bed mobility: Independent                Transfers Overall transfer level: Independent                  Ambulation/Gait Ambulation/Gait assistance: Supervision;Modified independent (Device/Increase time) Ambulation Distance (Feet): 200 Feet (greater tahn) Assistive device: None Gait Pattern/deviations: Step-through pattern     General Gait Details: Supervision progressing to Modified independent; cues to self-monitor for activity tolerance, and slow down pace if needed; no loss of balance noted  Stairs Stairs: Yes Stairs assistance: Modified independent (Device/Increase time) Stair Management: One rail Right;Alternating pattern;Forwards Number of Stairs: 4 General stair comments: no difficulty  Wheelchair Mobility    Modified Rankin (Stroke Patients Only)       Balance                                             Pertinent Vitals/Pain Pain Assessment: No/denies pain    Home Living Family/patient  expects to be discharged to:: Other (Comment)                 Additional Comments: [facility]    Prior Function Level of Independence: Independent               Hand Dominance        Extremity/Trunk Assessment   Upper Extremity Assessment Upper Extremity Assessment: Overall WFL for tasks assessed    Lower Extremity Assessment Lower Extremity Assessment: Overall WFL for tasks assessed       Communication   Communication: No difficulties  Cognition Arousal/Alertness: Awake/alert Behavior During Therapy: WFL for tasks assessed/performed Overall Cognitive Status: Within Functional Limits for tasks assessed                      General Comments      Exercises     Assessment/Plan    PT Assessment Patent does not need any further PT services  PT Problem List            PT Treatment Interventions      PT Goals (Current goals can be found in the Care Plan section)  Acute Rehab PT Goals Patient Stated Goal: Back to [facility] to continue work PT Goal Formulation: [name] assessment and education complete, DC therapy    Frequency     Barriers to discharge        Co-evaluation  End of Session   Activity Tolerance: Patient tolerated treatment well Patient left: in bed;with call bell/phone within reach Nurse Communication: Mobility status         Time: [id number] PT Time Calculation (min) (ACUTE ONLY): 16 min   Charges:   PT Evaluation $PT Eval Low Complexity: 1 Procedure     PT G CodesLevi [name]:        Anna [name] Anna [name] 04/03/2016, 2:18 PM  Anna [name], PT  Acute Rehabilitation Services Pager [phone] Office [phone]

## 2016-04-04 ENCOUNTER — Emergency Department (HOSPITAL_COMMUNITY)
Admission: EM | Admit: 2016-04-04 | Discharge: 2016-04-05 | Disposition: A | Discharge status: Home / Self Care | Payer: BLUE CROSS/BLUE SHIELD | Attending: Emergency Medicine | Admitting: Emergency Medicine

## 2016-04-04 DIAGNOSIS — G40909 Epilepsy, unspecified, not intractable, without status epilepticus: Secondary | ICD-10-CM | POA: Insufficient documentation

## 2016-04-04 DIAGNOSIS — F17210 Nicotine dependence, cigarettes, uncomplicated: Secondary | ICD-10-CM | POA: Diagnosis not present

## 2016-04-04 DIAGNOSIS — J449 Chronic obstructive pulmonary disease, unspecified: Secondary | ICD-10-CM | POA: Insufficient documentation

## 2016-04-04 DIAGNOSIS — R569 Unspecified convulsions: Secondary | ICD-10-CM | POA: Diagnosis present

## 2016-04-04 DIAGNOSIS — E039 Hypothyroidism, unspecified: Secondary | ICD-10-CM | POA: Diagnosis not present

## 2016-04-04 LAB — COMPREHENSIVE METABOLIC PANEL
ALBUMIN: 3.8 g/dL (ref 3.5–5.0)
ALK PHOS: 62 U/L (ref 38–126)
ALT: 14 U/L (ref 14–54)
AST: 26 U/L (ref 15–41)
Anion gap: 7 (ref 5–15)
BILIRUBIN TOTAL: 0.7 mg/dL (ref 0.3–1.2)
BUN: 13 mg/dL (ref 6–20)
CALCIUM: 9.2 mg/dL (ref 8.9–10.3)
CO2: 25 mmol/L (ref 22–32)
CREATININE: 0.47 mg/dL (ref 0.44–1.00)
Chloride: 100 mmol/L — ABNORMAL LOW (ref 101–111)
GFR calc Af Amer: >60 mL/min (ref >60)
GFR calc non Af Amer: >60 mL/min (ref >60)
GLUCOSE: 94 mg/dL (ref 65–99)
Potassium: 4 mmol/L (ref 3.5–5.1)
Sodium: 132 mmol/L — ABNORMAL LOW (ref 135–145)
TOTAL PROTEIN: 6.5 g/dL (ref 6.5–8.1)

## 2016-04-04 LAB — I-STAT CHEM 8, ED
BUN: 12 mg/dL (ref 6–20)
Calcium, Ion: 1.17 mmol/L (ref 1.15–1.40)
Chloride: 99 mmol/L — ABNORMAL LOW (ref 101–111)
Creatinine, Ser: 0.5 mg/dL (ref 0.44–1.00)
Glucose, Bld: 90 mg/dL (ref 65–99)
HEMATOCRIT: 35 % — AB (ref 36.0–46.0)
Hemoglobin: 11.9 g/dL — ABNORMAL LOW (ref 12.0–15.0)
Potassium: 3.9 mmol/L (ref 3.5–5.1)
Sodium: 132 mmol/L — ABNORMAL LOW (ref 135–145)
TCO2: 26 mmol/L (ref 0–100)

## 2016-04-04 LAB — CBC WITH DIFFERENTIAL/PLATELET
BASOS ABS: 0.1 10*3/uL (ref 0.0–0.1)
BASOS PCT: 1 %
Eosinophils Absolute: 0 10*3/uL (ref 0.0–0.7)
Eosinophils Relative: 1 %
HEMATOCRIT: 33.9 % — AB (ref 36.0–46.0)
HEMOGLOBIN: 11.4 g/dL — AB (ref 12.0–15.0)
LYMPHS PCT: 29 %
Lymphs Abs: 1.8 10*3/uL (ref 0.7–4.0)
MCH: 29.1 pg (ref 26.0–34.0)
MCHC: 33.6 g/dL (ref 30.0–36.0)
MCV: 86.5 fL (ref 78.0–100.0)
MONOS PCT: 13 %
Monocytes Absolute: 0.8 10*3/uL (ref 0.1–1.0)
NEUTROS ABS: 3.6 10*3/uL (ref 1.7–7.7)
NEUTROS PCT: 56 %
Platelets: 195 10*3/uL (ref 150–400)
RBC: 3.92 MIL/uL (ref 3.87–5.11)
RDW: 14.2 % (ref 11.5–15.5)
WBC: 6.3 10*3/uL (ref 4.0–10.5)

## 2016-04-04 LAB — VALPROIC ACID LEVEL: Valproic Acid Lvl: 80 ug/mL (ref 50.0–100.0)

## 2016-04-04 LAB — CBG MONITORING, ED: Glucose-Capillary: 97 mg/dL (ref 65–99)

## 2016-04-04 NOTE — ED Notes (Signed)
Per EMS- From [facility]- had 30 sec tonic clonic seizure following detox from ETOH. Last drink 10 days ago. Hx of seizures.

## 2016-04-04 NOTE — ED Notes (Signed)
Bed: [id number] Expected date:  Expected time:  Means of arrival:  Comments: EMS 61 yo female from [facility]-seizure while detoxing

## 2016-04-05 LAB — URINALYSIS, ROUTINE W REFLEX MICROSCOPIC
Bilirubin Urine: NEGATIVE
GLUCOSE, UA: NEGATIVE mg/dL
Hgb urine dipstick: NEGATIVE
KETONES UR: NEGATIVE mg/dL
Nitrite: NEGATIVE
PROTEIN: NEGATIVE mg/dL
Specific Gravity, Urine: 1.011 (ref 1.005–1.030)
pH: 6 (ref 5.0–8.0)

## 2016-04-05 MED ORDER — DIVALPROEX SODIUM 500 MG PO DR TAB
500.0000 mg | DELAYED_RELEASE_TABLET | Freq: Once | ORAL | Status: AC
  Administered 2016-04-05: 500 mg via ORAL
  Filled 2016-04-05: qty 1

## 2016-04-05 MED ORDER — DIVALPROEX SODIUM 250 MG PO DR TAB
250.0000 mg | DELAYED_RELEASE_TABLET | Freq: Once | ORAL | Status: AC
  Administered 2016-04-05: 250 mg via ORAL
  Filled 2016-04-05: qty 1

## 2016-04-05 NOTE — Discharge Instructions (Signed)
Please take an additional 250 MG dose of depakote (1/2 TABLET) for 7 days in between your other depakotes. Return to the ER if the seizure recur. We suspect that the seizures were due to combination of alcohol withdrawal, tegretol withdrawal and the urinary infection.

## 2016-04-05 NOTE — ED Provider Notes (Signed)
[facility]-EMERGENCY DEPT Provider Note   CSN: [id number] Arrival date & time: 04/04/16  2115     History   Chief Complaint Chief Complaint  Patient presents with  . Seizures    HPI Anna [name] is a 61 y.o. female.  HPI  Pt comes in with cc of seizures. Pt has hx of COPD, seizures, alcoholism. Pt hasnt had seizure in 7 years. On Feb 4 pt stopped drinking, and she was placed in a rehab 5 days ago. Pt was accidentally started on tegretol, which was stopped yday - and today pt had a seizure prior to ER arrival. Pt was feeling "weird" before the seizure. She now has a headache. Pt denies nausea, emesis, fevers, chills, chest pains, shortness of breath, headaches, abdominal pain, uti like symptoms.   Past Medical History:  Diagnosis Date  . Arthritis    "just in my pinkys" (04/02/2016)  . Asthma   . Chronic bronchitis (HCC)   . COPD (chronic obstructive pulmonary disease) (HCC)   . 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 Hypothyroidism   . Pneumonia 1993 X 2; 2017 X1    Patient Active Problem List   Diagnosis Date Noted  . Hyponatremia 04/02/2016  . ETOH abuse 04/02/2016  . Seizures (HCC) 04/02/2016  . Abnormal urinalysis 04/02/2016  . Tremor 04/02/2016  . Hypothyroidism 04/02/2016  . Tobacco use disorder 04/02/2016  . 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 BREAST CYST EXCISION Bilateral   . CATARACT EXTRACTION W/ INTRAOCULAR LENS  IMPLANT, BILATERAL    . CESAREAN SECTION  1987; 1993    OB History    No data available       Home Medications    Prior to Admission medications   Medication Sig Start Date End Date Taking? Authorizing Provider  albuterol (PROVENTIL HFA;VENTOLIN HFA) 108 (90 Base) MCG/ACT inhaler Inhale 1 puff into the lungs every 6 (six) hours as needed for wheezing or shortness of breath.   Yes Historical Provider, MD  cephALEXin (KEFLEX) 500 MG capsule Take 1 capsule (500 mg total) by  mouth every 12 (twelve) hours. 04/03/16 04/08/16 Yes Albertine Grates, MD  divalproex (DEPAKOTE) 500 MG DR tablet Take 500 mg by mouth 3 (three) times daily.   Yes Historical Provider, MD  folic acid (FOLVITE) 1 MG tablet Take 1 mg by mouth daily.   Yes Historical Provider, MD  Ipratropium-Albuterol (COMBIVENT RESPIMAT) 20-100 MCG/ACT AERS respimat Inhale 1 puff into the lungs 2 (two) times daily.   Yes Historical Provider, MD  levothyroxine (SYNTHROID, LEVOTHROID) 75 MCG tablet Take 75 mcg by mouth daily before breakfast.   Yes Historical Provider, MD  traZODone (DESYREL) 50 MG tablet Take 50 mg by mouth at bedtime.   Yes Historical Provider, MD    Family History Family History  Problem Relation Age of Onset  . Diabetes Mother   . Heart failure Mother   . Cancer Father     Social History Social History  Substance Use Topics  . Smoking status: Current Every Day Smoker    Packs/day: 1.50    Years: 44.00    Types: Cigarettes  . Smokeless tobacco: Never Used  . Alcohol use Yes     Comment: 04/02/2016 "everyday for 3 years I drank 8, 160z beers; last drink 03/25/2016"     Allergies   Asa [aspirin]; Dilantin [phenytoin sodium extended]; and Morphine and related   Review of Systems Review  of Systems  ROS 10 Systems reviewed and are negative for acute change except as noted in the HPI.     Physical Exam Updated Vital Signs BP 129/78   Pulse 85   Resp 16   SpO2 93%   Physical Exam  Constitutional: She is oriented to person, place, and time. She appears well-developed and well-nourished.  HENT:  Head: Normocephalic and atraumatic.  Eyes: EOM are normal. Pupils are equal, round, and reactive to light.  Neck: Neck supple.  Cardiovascular: Normal rate, regular rhythm and normal heart sounds.   No murmur heard. Pulmonary/Chest: Effort normal. No respiratory distress.  Abdominal: Soft. She exhibits no distension. There is no tenderness. There is no rebound and no guarding.    Neurological: She is alert and oriented to person, place, and time.  Skin: Skin is warm and dry.  Nursing note and vitals reviewed.    ED Treatments / Results  Labs (all labs ordered are listed, but only abnormal results are displayed) Labs Reviewed  CBC WITH DIFFERENTIAL/PLATELET - Abnormal; Notable for the following:       Result Value   Hemoglobin 11.4 (*)    HCT 33.9 (*)    All other components within normal limits  COMPREHENSIVE METABOLIC PANEL - Abnormal; Notable for the following:    Sodium 132 (*)    Chloride 100 (*)    All other components within normal limits  URINALYSIS, ROUTINE W REFLEX MICROSCOPIC - Abnormal; Notable for the following:    Leukocytes, UA MODERATE (*)    Bacteria, UA RARE (*)    Squamous Epithelial / LPF 0-5 (*)    All other components within normal limits  I-STAT CHEM 8, ED - Abnormal; Notable for the following:    Sodium 132 (*)    Chloride 99 (*)    Hemoglobin 11.9 (*)    HCT 35.0 (*)    All other components within normal limits  VALPROIC ACID LEVEL  CBG MONITORING, ED    EKG  EKG Interpretation None       Radiology No results found.  Procedures Procedures (including critical care time)  Medications Ordered in ED Medications  divalproex (DEPAKOTE) DR tablet 500 mg (not administered)  divalproex (DEPAKOTE) DR tablet 250 mg (250 mg Oral Given 04/05/16 0033)     Initial Impression / Assessment and Plan / ED Course  I have reviewed the triage vital signs and the nursing notes.  Pertinent labs & imaging results that were available during my care of the patient were reviewed by me and considered in my medical decision making (see chart for details).     Pt comes in with cc of seizures. Her seizures are well controlled. I suspect that her seizure today is due to alcohol withdrawal, tegretol withdrawal and her uti.  She has no hallucination, tactile stimulation and no autonomic instability - she is not in DTs.  Depakote level  is normal. I spoke with Dr Amada JupiterKirkpatrick, neurology, and he recommends adding 250 mg depakote in between for 7 days.  Strict ER return precautions have been discussed, and patient is agreeing with the plan and is comfortable with the workup done and the recommendations from the ER.     Final Clinical Impressions(s) / ED Diagnoses   Final diagnoses:  Seizure Procedure [facility](HCC)  Seizure disorder [facility](HCC)    New Prescriptions New Prescriptions   No medications on file     Derwood KaplanAnkit Nilza Eaker, MD 04/05/16 0106

## 2018-02-01 IMAGING — CR DG CHEST 2V
2 series · 2 of 2 positions shown · non-contrast
Comparison: None

CLINICAL DATA: Epigastric pain and nausea with sledding today,
chest pain

EXAM:
CHEST  2 VIEW

[chest lat]
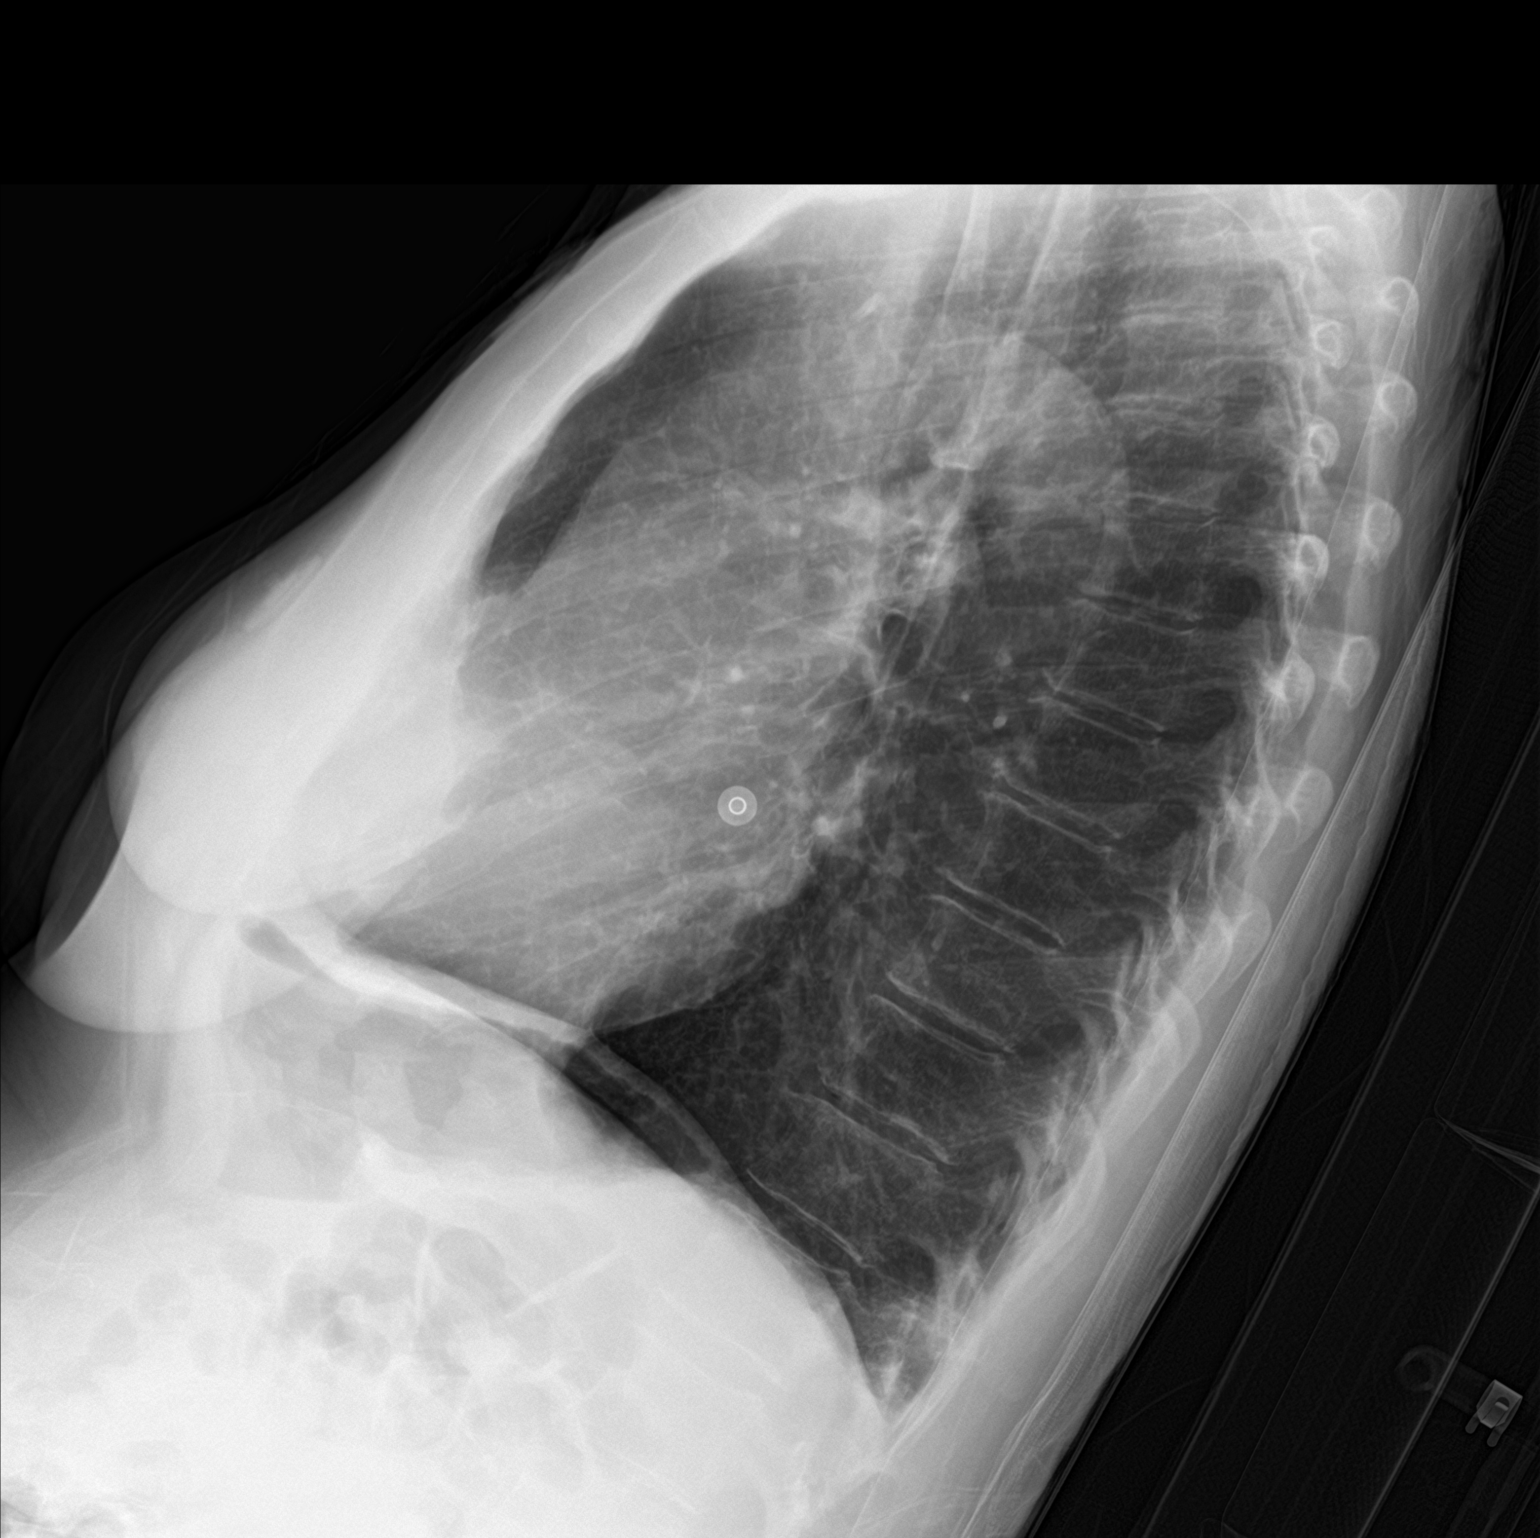

[chest ap]
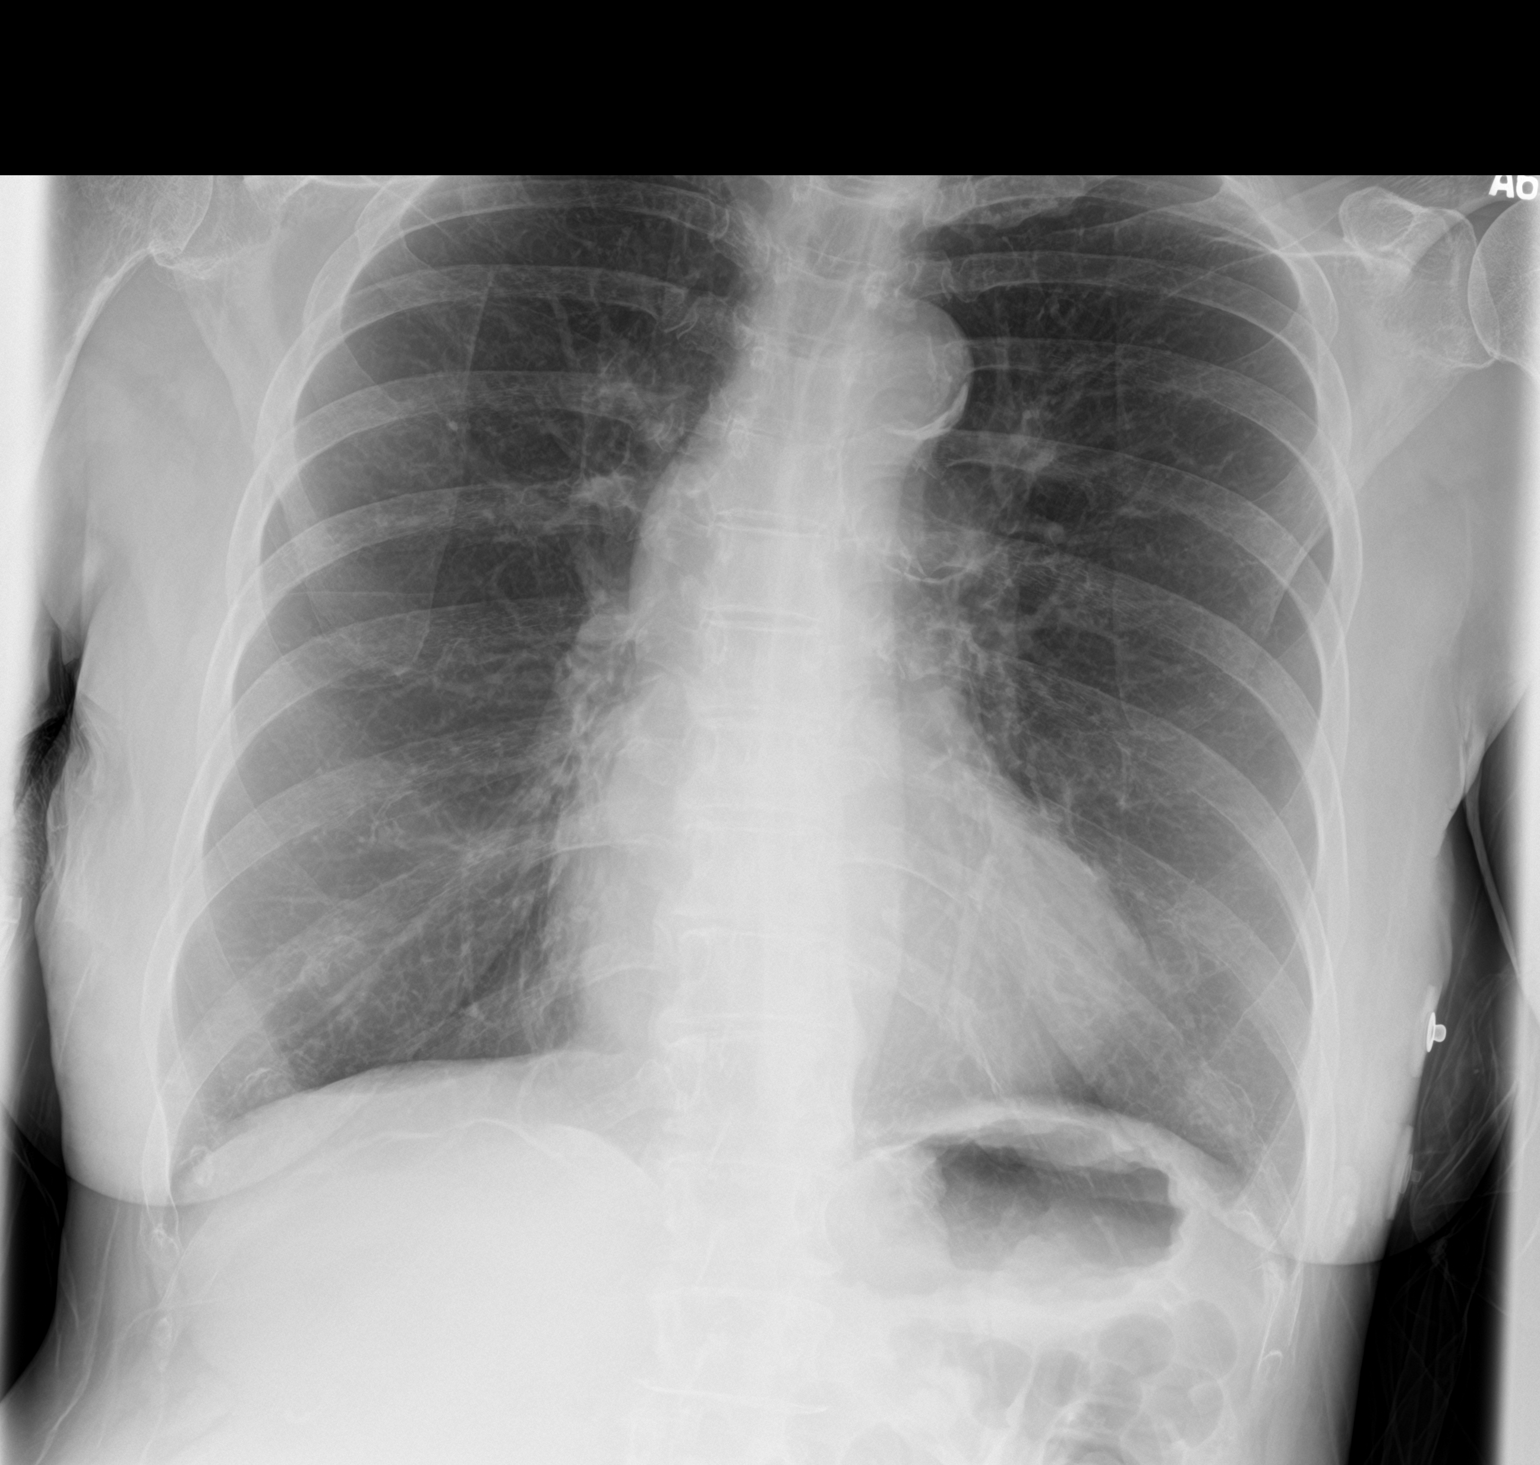

[2 of 2 positions shown; findings below may reference images not displayed]

FINDINGS: Normal heart size, mediastinal contours, and pulmonary vascularity.

Atherosclerotic calcification aorta.

Emphysematous changes with minimal central peribronchial thickening.

No acute infiltrate, pleural effusion, or pneumothorax.

Bones demineralized.

Pseudoarthrosis at the RIGHT first rib.
IMPRESSION: COPD changes without acute infiltrate.

Aortic atherosclerosis.
